# Patient Record
Sex: Female | Born: 1970 | Race: Black or African American | Hispanic: No | Marital: Married | Smoking: Never smoker
Health system: Southern US, Community
[De-identification: ages and names within clinical notes are randomized; demographics above are authoritative.]

## PROBLEM LIST (undated history)

## (undated) DIAGNOSIS — R9431 Abnormal electrocardiogram [ECG] [EKG]: Secondary | ICD-10-CM

## (undated) DIAGNOSIS — K589 Irritable bowel syndrome without diarrhea: Secondary | ICD-10-CM

## (undated) DIAGNOSIS — Z87448 Personal history of other diseases of urinary system: Secondary | ICD-10-CM

## (undated) DIAGNOSIS — D649 Anemia, unspecified: Secondary | ICD-10-CM

## (undated) DIAGNOSIS — M25511 Pain in right shoulder: Secondary | ICD-10-CM

## (undated) DIAGNOSIS — J45909 Unspecified asthma, uncomplicated: Secondary | ICD-10-CM

## (undated) DIAGNOSIS — K219 Gastro-esophageal reflux disease without esophagitis: Secondary | ICD-10-CM

## (undated) HISTORY — DX: Abnormal electrocardiogram (ECG) (EKG): R94.31

## (undated) HISTORY — DX: Irritable bowel syndrome, unspecified: K58.9

## (undated) HISTORY — PX: OTHER SURGICAL HISTORY: SHX169

## (undated) HISTORY — DX: Gastro-esophageal reflux disease without esophagitis: K21.9

## (undated) HISTORY — PX: ABLATION: SHX5711

## (undated) HISTORY — DX: Personal history of other diseases of urinary system: Z87.448

## (undated) HISTORY — DX: Anemia, unspecified: D64.9

---
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
## 1993-01-04 HISTORY — PX: TONSILLECTOMY: SUR1361

## 2004-04-04 HISTORY — PX: COLONOSCOPY W/ ENDOSCOPIC US: SHX1379

## 2004-04-16 ENCOUNTER — Ambulatory Visit: Payer: Self-pay | Admitting: Family Medicine

## 2004-05-04 HISTORY — PX: COLONOSCOPY: SHX174

## 2004-05-04 HISTORY — PX: ESOPHAGOGASTRODUODENOSCOPY: SHX1529

## 2004-06-04 HISTORY — PX: OTHER SURGICAL HISTORY: SHX169

## 2004-06-10 ENCOUNTER — Ambulatory Visit: Payer: Self-pay | Admitting: Family Medicine

## 2004-06-19 ENCOUNTER — Ambulatory Visit: Payer: Self-pay | Admitting: General Surgery

## 2005-01-13 ENCOUNTER — Ambulatory Visit: Payer: Self-pay | Admitting: Family Medicine

## 2005-01-19 ENCOUNTER — Ambulatory Visit: Payer: Self-pay | Admitting: Family Medicine

## 2005-02-04 HISTORY — PX: CHOLECYSTECTOMY: SHX55

## 2005-02-24 ENCOUNTER — Ambulatory Visit: Payer: Self-pay | Admitting: General Surgery

## 2005-03-11 ENCOUNTER — Ambulatory Visit: Payer: Self-pay | Admitting: Family Medicine

## 2005-06-07 ENCOUNTER — Ambulatory Visit: Payer: Self-pay | Admitting: Family Medicine

## 2005-11-23 ENCOUNTER — Ambulatory Visit: Payer: Self-pay

## 2005-12-23 ENCOUNTER — Ambulatory Visit: Payer: Self-pay | Admitting: Orthopaedic Surgery

## 2006-01-04 HISTORY — PX: SHOULDER ARTHROSCOPY W/ SUBACROMIAL DECOMPRESSION AND DISTAL CLAVICLE EXCISION: SHX2401

## 2006-01-13 ENCOUNTER — Ambulatory Visit: Payer: Self-pay | Admitting: Family Medicine

## 2006-01-13 ENCOUNTER — Other Ambulatory Visit: Admission: RE | Admit: 2006-01-13 | Discharge: 2006-01-13 | Payer: Self-pay | Admitting: Family Medicine

## 2006-01-13 ENCOUNTER — Encounter: Payer: Self-pay | Admitting: Family Medicine

## 2006-01-13 LAB — CONVERTED CEMR LAB
ALT: 14 units/L (ref 0–40)
AST: 23 units/L (ref 0–37)
BUN: 5 mg/dL — ABNORMAL LOW (ref 6–23)
Basophils Relative: 0.3 % (ref 0.0–1.0)
CO2: 29 meq/L (ref 19–32)
Creatinine, Ser: 0.8 mg/dL (ref 0.4–1.2)
Eosinophil percent: 0.2 % (ref 0.0–5.0)
Glucose, Bld: 93 mg/dL (ref 70–99)
HCT: 37.5 % (ref 36.0–46.0)
Hemoglobin: 12.3 g/dL (ref 12.0–15.0)
LDL Cholesterol: 74 mg/dL (ref 0–99)
Lymphocytes Relative: 22.8 % (ref 12.0–46.0)
Monocytes Absolute: 0.4 10*3/uL (ref 0.2–0.7)
Monocytes Relative: 6.6 % (ref 3.0–11.0)
Neutro Abs: 4.2 10*3/uL (ref 1.4–7.7)
Pap Smear: NORMAL
Phosphorus Concentration: 4 mg/dL (ref 2.3–4.6)
Potassium: 4 meq/L (ref 3.5–5.1)
RDW: 12 % (ref 11.5–14.6)
VLDL: 8 mg/dL (ref 0–40)
WBC: 5.9 10*3/uL (ref 4.5–10.5)

## 2006-12-20 ENCOUNTER — Telehealth: Payer: Self-pay | Admitting: Family Medicine

## 2006-12-21 ENCOUNTER — Encounter: Payer: Self-pay | Admitting: Family Medicine

## 2006-12-21 DIAGNOSIS — J309 Allergic rhinitis, unspecified: Secondary | ICD-10-CM | POA: Insufficient documentation

## 2006-12-21 DIAGNOSIS — D649 Anemia, unspecified: Secondary | ICD-10-CM | POA: Insufficient documentation

## 2006-12-21 DIAGNOSIS — G4730 Sleep apnea, unspecified: Secondary | ICD-10-CM | POA: Insufficient documentation

## 2006-12-21 DIAGNOSIS — K219 Gastro-esophageal reflux disease without esophagitis: Secondary | ICD-10-CM | POA: Insufficient documentation

## 2006-12-21 DIAGNOSIS — K589 Irritable bowel syndrome without diarrhea: Secondary | ICD-10-CM | POA: Insufficient documentation

## 2006-12-22 ENCOUNTER — Ambulatory Visit: Payer: Self-pay | Admitting: Family Medicine

## 2006-12-22 DIAGNOSIS — Z87448 Personal history of other diseases of urinary system: Secondary | ICD-10-CM

## 2006-12-22 LAB — CONVERTED CEMR LAB
Bilirubin Urine: NEGATIVE
Casts: 0 /lpf
Ketones, urine, test strip: NEGATIVE
Specific Gravity, Urine: 1.02
Urobilinogen, UA: 0.2
WBC, UA: 0 cells/hpf
Yeast, UA: 0

## 2006-12-26 ENCOUNTER — Ambulatory Visit: Payer: Self-pay | Admitting: Cardiology

## 2006-12-30 LAB — CONVERTED CEMR LAB
BUN: 2 mg/dL — ABNORMAL LOW (ref 6–23)
Basophils Relative: 0.3 % (ref 0.0–1.0)
CO2: 31 meq/L (ref 19–32)
Creatinine, Ser: 0.8 mg/dL (ref 0.4–1.2)
Eosinophils Relative: 0.4 % (ref 0.0–5.0)
Glucose, Bld: 89 mg/dL (ref 70–99)
HCT: 33.3 % — ABNORMAL LOW (ref 36.0–46.0)
Hemoglobin: 11.4 g/dL — ABNORMAL LOW (ref 12.0–15.0)
Lymphocytes Relative: 25.8 % (ref 12.0–46.0)
Monocytes Absolute: 0.4 10*3/uL (ref 0.2–0.7)
Neutro Abs: 2.9 10*3/uL (ref 1.4–7.7)
Neutrophils Relative %: 65.6 % (ref 43.0–77.0)
Potassium: 3.8 meq/L (ref 3.5–5.1)
RDW: 11.7 % (ref 11.5–14.6)
Sodium: 142 meq/L (ref 135–145)
WBC: 4.5 10*3/uL (ref 4.5–10.5)

## 2007-10-19 ENCOUNTER — Other Ambulatory Visit: Admission: RE | Admit: 2007-10-19 | Discharge: 2007-10-19 | Payer: Self-pay | Admitting: Family Medicine

## 2007-10-19 ENCOUNTER — Ambulatory Visit: Payer: Self-pay | Admitting: Family Medicine

## 2007-10-19 ENCOUNTER — Encounter: Payer: Self-pay | Admitting: Family Medicine

## 2007-10-23 LAB — CONVERTED CEMR LAB
ALT: 16 units/L (ref 0–35)
AST: 22 units/L (ref 0–37)
Basophils Absolute: 0 10*3/uL (ref 0.0–0.1)
Basophils Relative: 0.5 % (ref 0.0–3.0)
Bilirubin, Direct: 0.1 mg/dL (ref 0.0–0.3)
CO2: 29 meq/L (ref 19–32)
Chloride: 104 meq/L (ref 96–112)
Cholesterol: 148 mg/dL (ref 0–200)
Creatinine, Ser: 0.7 mg/dL (ref 0.4–1.2)
LDL Cholesterol: 75 mg/dL (ref 0–99)
Lymphocytes Relative: 23.9 % (ref 12.0–46.0)
MCHC: 34.2 g/dL (ref 30.0–36.0)
Monocytes Relative: 4.9 % (ref 3.0–12.0)
Neutrophils Relative %: 70.4 % (ref 43.0–77.0)
RBC: 3.94 M/uL (ref 3.87–5.11)
Sodium: 139 meq/L (ref 135–145)
Total Bilirubin: 1.2 mg/dL (ref 0.3–1.2)
Total CHOL/HDL Ratio: 2.1
VLDL: 4 mg/dL (ref 0–40)

## 2008-04-25 ENCOUNTER — Telehealth: Payer: Self-pay | Admitting: Family Medicine

## 2008-06-10 ENCOUNTER — Encounter: Payer: Self-pay | Admitting: Cardiovascular Disease

## 2008-06-10 ENCOUNTER — Ambulatory Visit: Payer: Self-pay | Admitting: Cardiovascular Disease

## 2008-06-10 DIAGNOSIS — R072 Precordial pain: Secondary | ICD-10-CM

## 2008-06-10 DIAGNOSIS — R079 Chest pain, unspecified: Secondary | ICD-10-CM

## 2008-06-12 ENCOUNTER — Ambulatory Visit: Payer: Self-pay | Admitting: Family Medicine

## 2008-06-12 DIAGNOSIS — R413 Other amnesia: Secondary | ICD-10-CM

## 2008-06-12 LAB — CONVERTED CEMR LAB
Nitrite: NEGATIVE
Protein, U semiquant: NEGATIVE
Urobilinogen, UA: 0.2
WBC Urine, dipstick: NEGATIVE

## 2008-06-24 ENCOUNTER — Telehealth: Payer: Self-pay | Admitting: Family Medicine

## 2008-06-27 ENCOUNTER — Ambulatory Visit: Payer: Self-pay | Admitting: Family Medicine

## 2008-06-27 DIAGNOSIS — R091 Pleurisy: Secondary | ICD-10-CM | POA: Insufficient documentation

## 2008-07-03 ENCOUNTER — Telehealth: Payer: Self-pay | Admitting: Family Medicine

## 2008-07-09 ENCOUNTER — Encounter: Payer: Self-pay | Admitting: Cardiovascular Disease

## 2008-07-09 ENCOUNTER — Ambulatory Visit: Payer: Self-pay

## 2008-07-19 ENCOUNTER — Ambulatory Visit: Payer: Self-pay | Admitting: Cardiology

## 2008-07-19 ENCOUNTER — Encounter: Payer: Self-pay | Admitting: Cardiology

## 2009-12-08 ENCOUNTER — Ambulatory Visit: Payer: Self-pay | Admitting: Unknown Physician Specialty

## 2010-05-19 NOTE — Assessment & Plan Note (Signed)
[facility] OFFICE NOTE   Laura Dickerson, Laura Dickerson                         MRN:          [id number]  DATE:12/26/2006                            DOB:          03-19-1970    I was asked by Dr. Milinda Antis to evaluate Laura Dickerson with an abnormal EKG.   HISTORY OF PRESENT ILLNESS:  [name] is a 40 year old married black  female who comes today with her husband with the above concern.   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   On the 15th she became extremely ill with a viral gastroenteritis.  She  had prolonged nausea and actually had to be admitted to the hospital for  IV fluids at [facility].  She was hypokalemic and hypomagnesemic per  her records.   During that hospital stay she had some chest discomfort, probably from  all the extreme vomiting.   Saw Dr. Milinda Antis in the office on December 22, 2006.  Her EKG showed some  biphasic T waves and T wave inversion in V1 through V4.  This was  despite her electrolytes being normalized.   She is totally asymptomatic.  She is extremely health conscious and  tries to exercise 2 to sometimes 3 times a week.  She has no symptoms of  coronary ischemia except that when she tries to jump rope, which she  just re-initiated, she gets a little bit short of breath.   She has no cardiac risk factors.  She says her lipids are good.   She denies any orthopnea, PND or peripheral edema.  She has had no  presyncope or syncope.   PAST MEDICAL HISTORY:  SHE IS INTOLERANT OF PENICILLIN.  She does not  smoke, does not drink or use recreational products.   SURGERIES:  1. She had a C-section in 1999 and 2002.  2. She has had a cholecystectomy February of 2007.  3. Tonsillectomy in 1995.  4. Shoulder surgery in December 2007.   MEDICINES:  Herbal vitamins.   FAMILY HISTORY:  Negative for premature coronary disease.   SOCIAL HISTORY:  She is married and has 2 children.   She lives in  [place].   REVIEW OF SYSTEMS:  Some occasional constipation and fatigue, otherwise  negative.   EXAMINATION:  Her blood pressure today is 116/73, her pulse is 62 and  regular, she is 5 foot 3, weighs 140 pounds, respiratory rate is 18.  SKIN:  Warm and dry, unremarkable.  Is alert and oriented x3.  HEENT:  Normocephalic/atraumatic, PERRLA, extraocular movements intact,  sclerae are clear, facial symmetry is normal.  NECK:  Supple, carotid upstrokes were equal bilaterally without bruits,  no JVD, thyroid is not enlarged, trachea is midline.  LUNGS:  Clear, no rub.  There is no chest wall tenderness.  PMI is nondisplaced, she has normal S1 and S2, this splits with  inspiration.  There is no S4, there is no gallop, there is no click.  ABDOMINAL:  Soft with good bowel sounds, no midline bruit.  EXTREMITIES:  Reveal no edema,  pulses are intact.  NEURO:  Exam is intact.   I repeated her EKG today which shows sinus bradycardia at a rate of 59  with T wave inversion in T1, biphasic T wave in V2.   ASSESSMENT AND PLAN:  Laura Dickerson clearly is extremely heart healthy and  has no significant risk factors for any coronary disease.  There is no  sign or symptoms of any kind of underlying cardiac problem.  Her T wave  inversion is largely resolved, though I told her it is normal for women  to have T wave inversion in V1 and sometimes even in V2.   At this point I do not see any need for further cardiac evaluation.  I  have encouraged her to exercise 3 hours a week.  Will see her back on a  p.r.n. basis.     Thomas C. Daleen Squibb, MD, [facility]  Electronically Signed    TCW/MedQ  DD: 12/26/2006  DT: 12/26/2006  Job #: [id number]   cc:   Marne A. Milinda Antis, MD

## 2010-06-15 ENCOUNTER — Encounter: Payer: Self-pay | Admitting: Cardiovascular Disease

## 2010-09-06 ENCOUNTER — Ambulatory Visit: Payer: Self-pay

## 2011-02-04 ENCOUNTER — Ambulatory Visit: Payer: Self-pay | Admitting: Internal Medicine

## 2012-07-31 IMAGING — CT CT HEAD WITHOUT CONTRAST
2 series · 15 of 30 positions shown, 19 images · non-contrast
Comparison: none

REASON FOR EXAM: STAT CR [id number] per CT severe headache nausea
COMMENTS:

PROCEDURE:     CT  - CT HEAD WITHOUT CONTRAST  - February 04, 2011 [DATE]
RESULT:     Comparison:  None
TECHNIQUE: Multiple axial images from the foramen magnum to the vertex were
obtained without IV contrast.

[Series 2: without · axial · non-contrast · 0.39mm/px · z∈[-122,-8]mm · 13 of 27 slices shown, 17 images]
[im 2/27  brain]
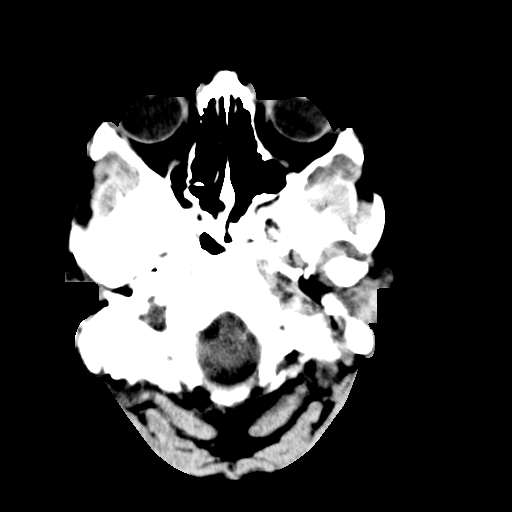
[im 2/27  bone]
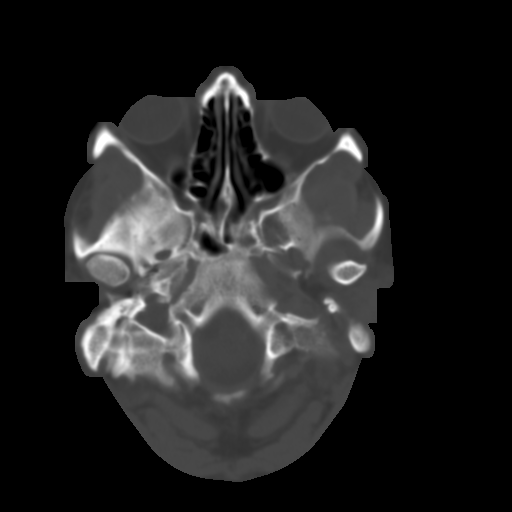
[im 4/27  brain]
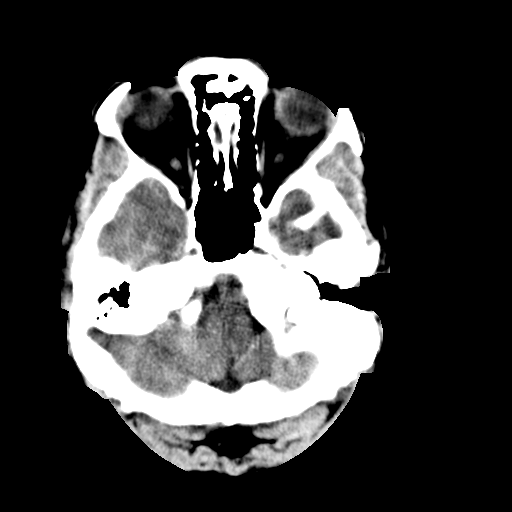
[im 6/27  brain]
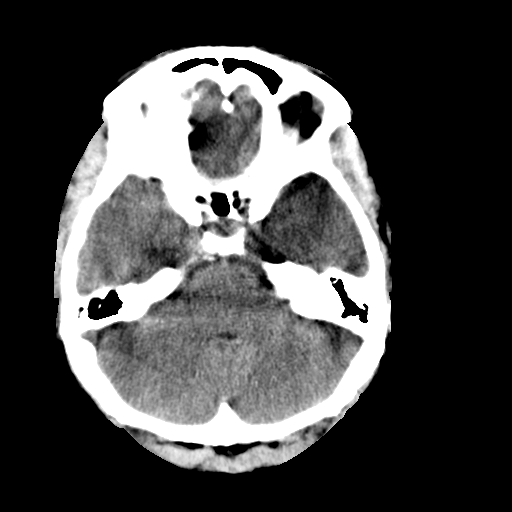
[im 8/27  brain]
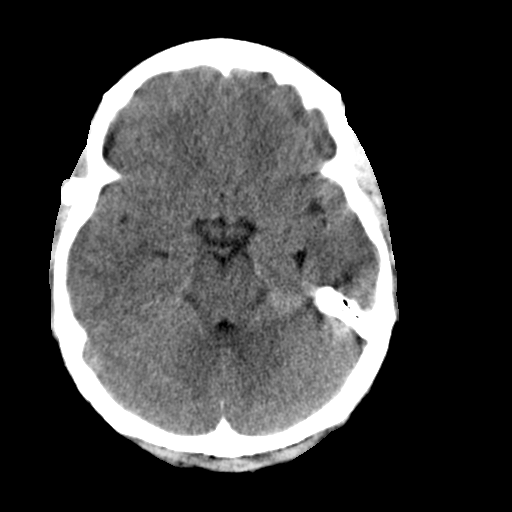
[im 10/27  brain]
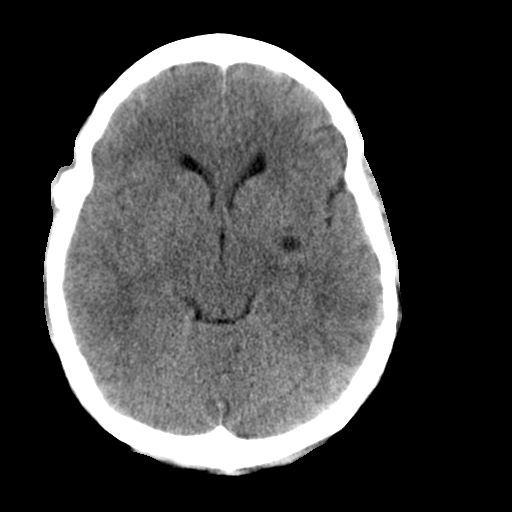
[im 10/27  bone]
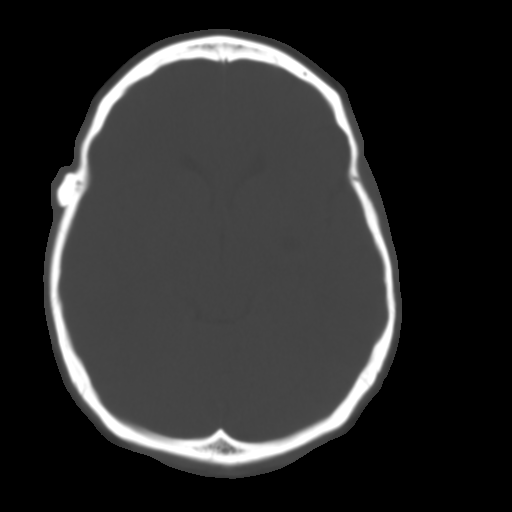
[im 12/27  brain]
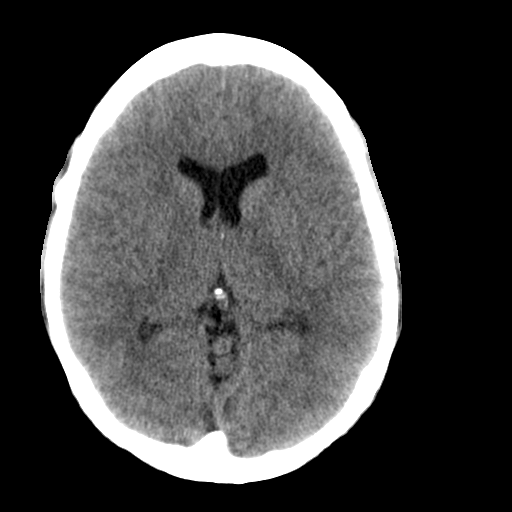
[im 14/27  brain]
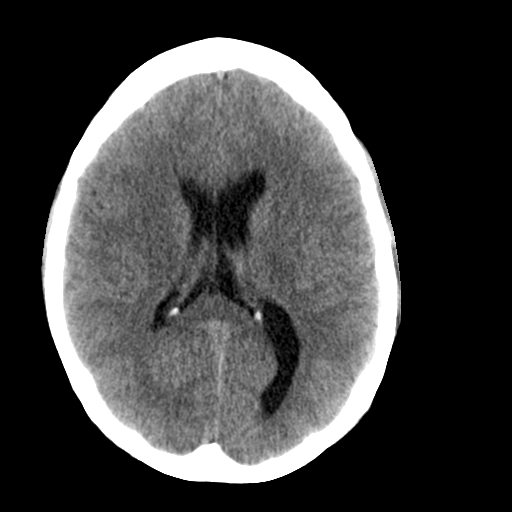
[im 15/27  brain]
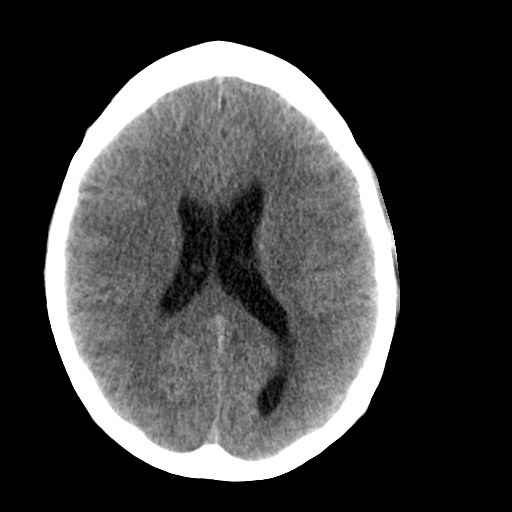
[im 17/27  brain]
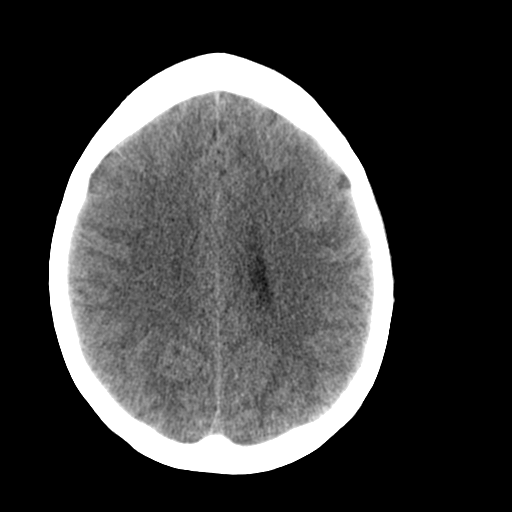
[im 17/27  bone]
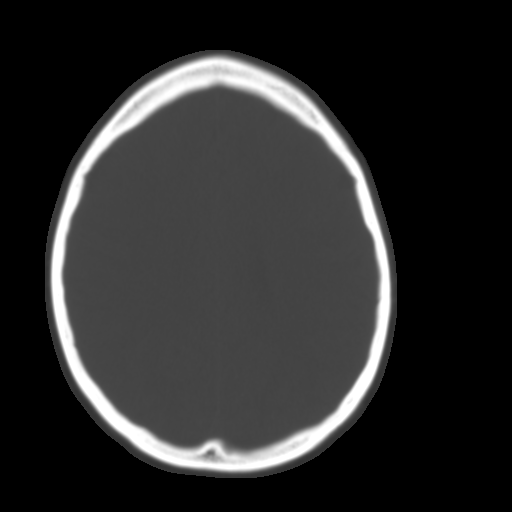
[im 19/27  brain]
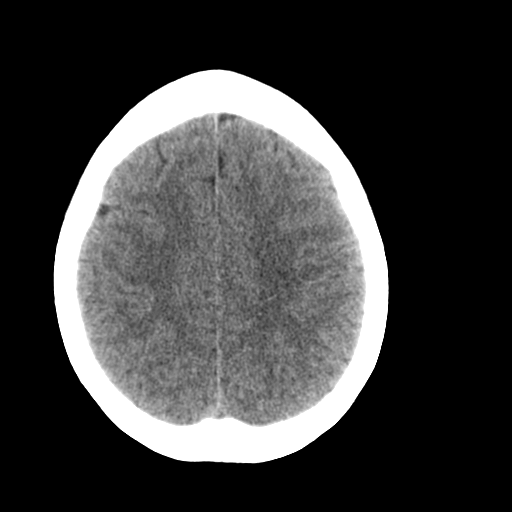
[im 21/27  brain]
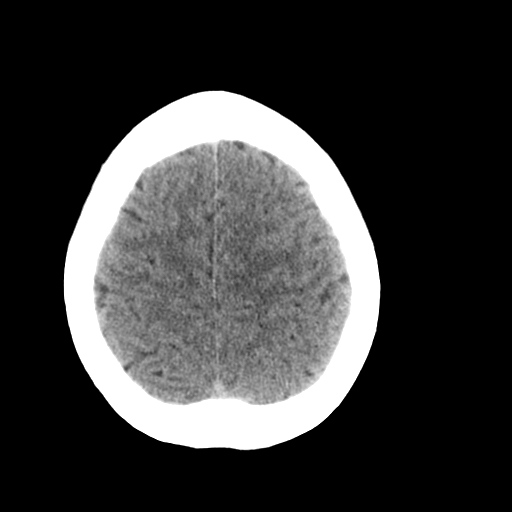
[im 23/27  brain]
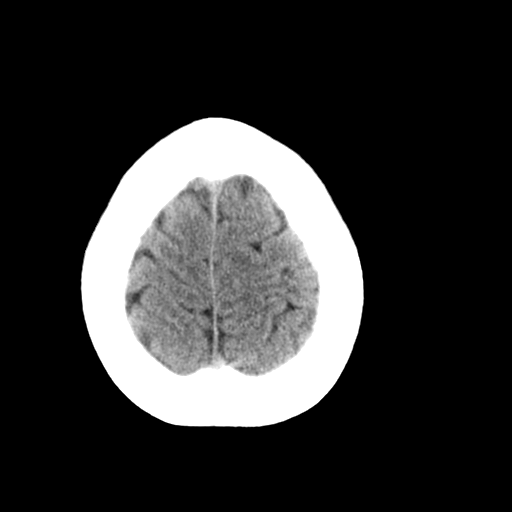
[im 25/27  brain]
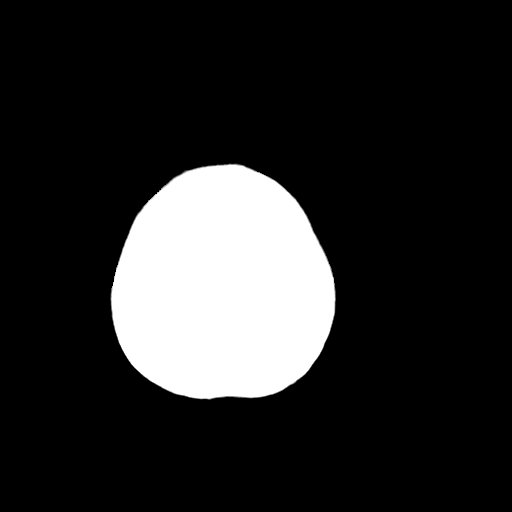
[im 25/27  bone]
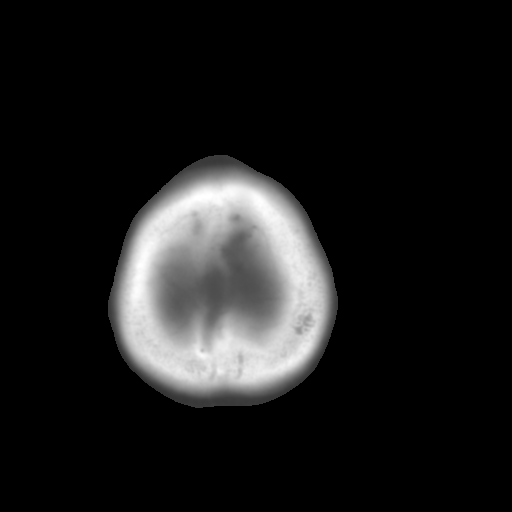

[Series 3: bone · axial · 0.39mm/px · z∈[-122,-102]mm · 2 of 27 slices shown]
[im 2/27  bone]
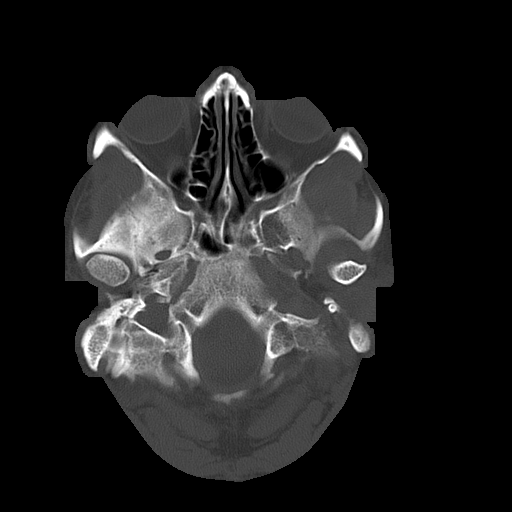
[im 6/27  bone]
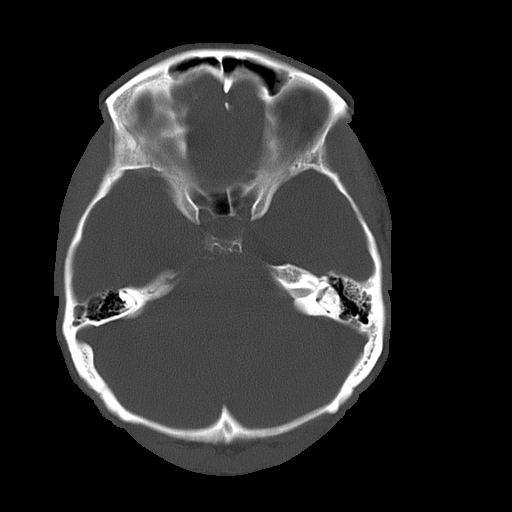

[15 of 30 positions shown; findings below may reference images not displayed]

FINDINGS: There is no evidence of mass effect or midline shift. No extra-axial fluid
collection. No intracranial hemorrhage. No acute cortical-based infarct.
There is a small round focus of hypoattenuation along the inferior aspect of
the left basal ganglia. This may represent a prominent Virchow-Robin space
versus an age-indeterminate lacunar infarct.

Small well-corticated ossific density along the lateral right frontal skull
is nonspecific, but likely an osteoma.
IMPRESSION: 1. No intracranial hemorrhage.
2. Small focus of hypoattenuation in the left basal ganglia may represent a
prominent Virchow-Robin space versus an age indeterminate, likely chronic,
lacunar infarct. Further evaluation with MRI is suggested.

This was called to the ordering clinician at the time of dictation.

## 2020-01-31 ENCOUNTER — Other Ambulatory Visit: Payer: Self-pay | Admitting: General Surgery

## 2020-01-31 NOTE — Progress Notes (Signed)
Subjective:     Patient ID: Laura Dickerson is a 50 y.o. female.  HPI  The following portions of the patient's history were reviewed and updated as appropriate.  This a new patient is here today for: office visit. The patient is here today to discuss a colonoscopy.      Chief Complaint  Patient presents with  . eval for colonoscopy     BP 120/80   Pulse 65   Temp 36.8 C (98.2 F)   Ht 160 cm (5' 3" )   Wt 67.3 kg (148 lb 6.4 oz)   BMI 26.29 kg/m       Past Medical History:  Diagnosis Date  . Allergic rhinitis   . Anemia   . Asthma without status asthmaticus, unspecified    NO INHALER IN 2 YEARS  . GERD (gastroesophageal reflux disease)   . History of hematuria   . IBS (irritable bowel syndrome)   . Right shoulder pain 2016          Past Surgical History:  Procedure Laterality Date  . ARTHROSCOPIC SUBACROMIAL DECOMP Right 08/04/2016   Procedure: ARTHROSCOPY, SHOULDER DECOMPRESSION SUBACROMIAL SPACE W/PARTIAL ACROMIOPLASTY, W/CORACOACROMIAL LIGAMENT RELEASE;  Surgeon: Alfonso Patten, MD;  Location: [facility] OR;  Service: Orthopedics;  Laterality: Right;  . ARTHROSCOPY SHOULDER DISTAL CLAVICULECTOMY W/DISTAL ARTICULAR SURFACE Right 08/04/2016   Procedure: ARTHROSCOPY, SHOULDER DISTAL CLAVICULECTOMY INCLUDING DISTAL ARTICULAR SURFACE ([facility] PROCEDURE);  Surgeon: Alfonso Patten, MD;  Location: [facility] OR;  Service: Orthopedics;  Laterality: Right;  . CESAREAN SECTION     X 2  . CHOLECYSTECTOMY  02/24/1996  . COLONOSCOPY  04/2004  . LAPAROSCOPIC CHOLECYSTECTOMY  2008  . MYRINGOTOMY  1978  . RIGHT SHOULDER IMPINGEMENT  2007  . TONSILLECTOMY    . UTERINE ABLATION  2014      OB History   No obstetric history on file.     Social History          Socioeconomic History  . Marital status: Married    Spouse name: BART  . Number of children: 2  . Years of education: Not on file  . Highest education level: Not on file   Occupational History  . Occupation: [employer] TECH  Tobacco Use  . Smoking status: Never Smoker  . Smokeless tobacco: Never Used  Vaping Use  . Vaping Use: Never used  Substance and Sexual Activity  . Alcohol use: No  . Drug use: No  . Sexual activity: Not on file  Other Topics Concern  . Not on file  Social History Narrative  . Not on file   Social Determinants of Health   Financial Resource Strain: Not on file  Food Insecurity: Not on file  Transportation Needs: Not on file            Allergies  Allergen Reactions  . Penicillins Hives, Rash and Other (See Comments)    REACTION: rash  . Augmentin [Amoxicillin-Pot Clavulanate] Hives and Nausea  . Doxycycline Nausea, Other (See Comments) and Vomiting    "GI upset"  . Oxycodone-Acetaminophen Nausea And Vomiting    Current Medications        Current Outpatient Medications  Medication Sig Dispense Refill  . acetaminophen (TYLENOL) 325 MG tablet Take 650 mg by mouth every 4 (four) hours as needed for Pain.    Marland Kitchen CALCIUM-MAGNESIUM-ZINC ORAL Take by mouth once daily    . cholecalciferol, vitamin D3, (VITAMIN D3) 125 mcg (5,000 unit) tablet Take 5,000 Units by mouth once daily    .  ibuprofen (ADVIL,MOTRIN) 200 MG tablet Take 400 mg by mouth every 6 (six) hours as needed for Pain.    . multivitamin capsule 1 tab by mouth daily    . vit C/multivit-mins/calcium/D3 (EMERGEN-C VITAMIN D-CALCIUM ORAL) Take by mouth once daily     No current facility-administered medications for this visit.           Family History  Problem Relation Age of Onset  . High blood pressure (Hypertension) Mother   . Stroke Father   . Myocardial Infarction (Heart attack) Father   . Breast cancer Maternal Grandmother   . Colon cancer Paternal Grandfather   . Anesthesia problems Neg Hx         Review of Systems  Constitutional: Negative for chills and fever.  Respiratory: Negative for cough.         Objective:   Physical Exam Exam conducted with a chaperone present.  Constitutional:      Appearance: Normal appearance.  Cardiovascular:     Rate and Rhythm: Normal rate and regular rhythm.     Pulses: Normal pulses.     Heart sounds: Normal heart sounds.  Pulmonary:     Effort: Pulmonary effort is normal.     Breath sounds: Normal breath sounds.  Musculoskeletal:     Cervical back: Neck supple.  Skin:    General: Skin is warm and dry.  Neurological:     Mental Status: She is alert and oriented to person, place, and time.  Psychiatric:        Mood and Affect: Mood normal.        Behavior: Behavior normal.    Labs and Radiology:   October 11, 2018:   Glucose 70 - 110 mg/dL 89   Sodium 136 - 145 mmol/L 136   Potassium 3.6 - 5.1 mmol/L 4.2   Chloride 97 - 109 mmol/L 101   Carbon Dioxide (CO2) 22.0 - 32.0 mmol/L 30.8   Urea Nitrogen (BUN) 7 - 25 mg/dL 12   Creatinine 0.6 - 1.1 mg/dL 0.7   Glomerular Filtration Rate (eGFR), MDRD Estimate >60 mL/min/1.73sq m 108   Calcium 8.7 - 10.3 mg/dL 9.8   AST  8 - 39 U/L 19   ALT  5 - 38 U/L 15   Alk Phos (alkaline Phosphatase) 34 - 104 U/L 90   Albumin 3.5 - 4.8 g/dL 4.4   Bilirubin, Total 0.3 - 1.2 mg/dL 0.6   Protein, Total 6.1 - 7.9 g/dL 6.9   A/G Ratio 1.0 - 5.0 gm/dL 1.8    WBC (White Blood Cell Count) 4.1 - 10.2 10^3/uL 7.6   RBC (Red Blood Cell Count) 4.04 - 5.48 10^6/uL 4.06   Hemoglobin 12.0 - 15.0 gm/dL 12.8   Hematocrit 35.0 - 47.0 % 38.6   MCV (Mean Corpuscular Volume) 80.0 - 100.0 fl 95.1   MCH (Mean Corpuscular Hemoglobin) 27.0 - 31.2 pg 31.5High   MCHC (Mean Corpuscular Hemoglobin Concentration) 32.0 - 36.0 gm/dL 33.2   Platelet Count 150 - 450 10^3/uL 241   RDW-CV (Red Cell Distribution Width) 11.6 - 14.8 % 11.9   MPV (Mean Platelet Volume) 9.4 - 12.4 fl 9.8   Neutrophils 1.50 - 7.80 10^3/uL 5.68   Lymphocytes 1.00 - 3.60 10^3/uL 1.43   Monocytes 0.00 - 1.50 10^3/uL 0.41   Eosinophils 0.00 - 0.55  10^3/uL 0.02   Basophils 0.00 - 0.09 10^3/uL 0.03   Neutrophil % 32.0 - 70.0 % 74.8High   Lymphocyte % 10.0 - 50.0 % 18.8  Monocyte % 4.0 - 13.0 % 5.4   Eosinophil % 1.0 - 5.0 % 0.3Low   Basophil% 0.0 - 2.0 % 0.4   Immature Granulocyte % <=0.7 % 0.3   Immature Granulocyte Count <=0.06 10^3/L 0.02           Assessment:     Candidate for colon cancer screening.       Plan:     Options for management reviewed including 1) colonoscopy versus 2) Cologuard testing.  Pros and cons of each reviewed.  Risks associated with colonoscopy discussed including bleeding and perforation.  Patient desires to proceed with colonoscopy.     Entered by Ledell Noss, CMA, acting as a scribe for Dr. Hervey Ard, MD.   The documentation recorded by the scribe accurately reflects the service I personally performed and the decisions made by me.   Robert Bellow, MD FACS

## 2020-02-21 ENCOUNTER — Encounter: Payer: Self-pay | Admitting: General Surgery

## 2020-02-21 ENCOUNTER — Other Ambulatory Visit: Payer: Self-pay

## 2020-02-21 ENCOUNTER — Other Ambulatory Visit
Admission: RE | Admit: 2020-02-21 | Discharge: 2020-02-21 | Disposition: A | Discharge status: Home / Self Care | Payer: 59 | Source: Ambulatory Visit | Attending: General Surgery | Admitting: General Surgery

## 2020-02-21 DIAGNOSIS — Z01812 Encounter for preprocedural laboratory examination: Secondary | ICD-10-CM | POA: Insufficient documentation

## 2020-02-21 DIAGNOSIS — Z20822 Contact with and (suspected) exposure to covid-19: Secondary | ICD-10-CM | POA: Diagnosis not present

## 2020-02-21 LAB — SARS CORONAVIRUS 2 (TAT 6-24 HRS): SARS Coronavirus 2: NEGATIVE

## 2020-02-22 ENCOUNTER — Other Ambulatory Visit: Payer: Self-pay

## 2020-02-22 ENCOUNTER — Ambulatory Visit
Admission: RE | Admit: 2020-02-22 | Discharge: 2020-02-22 | Disposition: A | Discharge status: Home / Self Care | Payer: 59 | Attending: General Surgery | Admitting: General Surgery

## 2020-02-22 ENCOUNTER — Encounter
Admission: RE | Disposition: A | Discharge status: Home / Self Care | Payer: Self-pay | Source: Home / Self Care | Attending: General Surgery

## 2020-02-22 ENCOUNTER — Ambulatory Visit: Payer: 59 | Admitting: Registered Nurse

## 2020-02-22 ENCOUNTER — Encounter: Payer: Self-pay | Admitting: General Surgery

## 2020-02-22 DIAGNOSIS — Z880 Allergy status to penicillin: Secondary | ICD-10-CM | POA: Insufficient documentation

## 2020-02-22 DIAGNOSIS — Z79899 Other long term (current) drug therapy: Secondary | ICD-10-CM | POA: Insufficient documentation

## 2020-02-22 DIAGNOSIS — Z1211 Encounter for screening for malignant neoplasm of colon: Secondary | ICD-10-CM | POA: Diagnosis not present

## 2020-02-22 DIAGNOSIS — Z885 Allergy status to narcotic agent status: Secondary | ICD-10-CM | POA: Insufficient documentation

## 2020-02-22 DIAGNOSIS — Z791 Long term (current) use of non-steroidal anti-inflammatories (NSAID): Secondary | ICD-10-CM | POA: Diagnosis not present

## 2020-02-22 DIAGNOSIS — Z881 Allergy status to other antibiotic agents status: Secondary | ICD-10-CM | POA: Insufficient documentation

## 2020-02-22 DIAGNOSIS — K5730 Diverticulosis of large intestine without perforation or abscess without bleeding: Secondary | ICD-10-CM | POA: Insufficient documentation

## 2020-02-22 HISTORY — DX: Pain in right shoulder: M25.511

## 2020-02-22 HISTORY — DX: Unspecified asthma, uncomplicated: J45.909

## 2020-02-22 HISTORY — PX: COLONOSCOPY WITH PROPOFOL: SHX5780

## 2020-02-22 SURGERY — COLONOSCOPY WITH PROPOFOL
Anesthesia: General

## 2020-02-22 MED ORDER — SODIUM CHLORIDE 0.9 % IV SOLN: INTRAVENOUS | Status: DC

## 2020-02-22 MED ORDER — PROPOFOL 500 MG/50ML IV EMUL
INTRAVENOUS | Status: DC | PRN
  Administered 2020-02-22: 140 ug/kg/min via INTRAVENOUS

## 2020-02-22 MED ORDER — PROPOFOL 10 MG/ML IV BOLUS
INTRAVENOUS | Status: DC | PRN
  Administered 2020-02-22: 20 mg via INTRAVENOUS
  Administered 2020-02-22: 70 mg via INTRAVENOUS

## 2020-02-22 NOTE — Op Note (Signed)
[facility] Gastroenterology Patient Name: Laura Dickerson Procedure Date: 02/22/2020 7:24 AM MRN: [id number] Account #: [id number] Date of Birth: 1970-03-03 Admit Type: Outpatient Age: 50 Room: [facility] ENDO ROOM 1 Gender: Female Note Status: Finalized Procedure:             Colonoscopy Indications:           Screening for colorectal malignant neoplasm Providers:             Earline Mayotte, MD Referring MD:          Audrie Gallus. Tower (Referring MD) Medicines:             Monitored Anesthesia Care Complications:         No immediate complications. Procedure:             Pre-Anesthesia Assessment:                        - Prior to the procedure, a History and Physical was                         performed, and patient medications, allergies and                         sensitivities were reviewed. The patient's tolerance                         of previous anesthesia was reviewed.                        - The risks and benefits of the procedure and the                         sedation options and risks were discussed with the                         patient. All questions were answered and informed                         consent was obtained.                        After obtaining informed consent, the colonoscope was                         passed under direct vision. Throughout the procedure,                         the patient's blood pressure, pulse, and oxygen                         saturations were monitored continuously. The                         Colonoscope was introduced through the anus and                         advanced to the the cecum, identified by appendiceal                         orifice and ileocecal  valve. The colonoscopy was                         performed without difficulty. The patient tolerated                         the procedure well. The quality of the bowel                         preparation was excellent. Findings:      A few  medium-mouthed diverticula were found in the sigmoid colon.      The retroflexed view of the distal rectum and anal verge was normal and       showed no anal or rectal abnormalities. Impression:            - Diverticulosis in the sigmoid colon.                        - The distal rectum and anal verge are normal on                         retroflexion view.                        - No specimens collected. Recommendation:        - Repeat colonoscopy in 10 years for screening                         purposes. Procedure Code(s):     --- Professional ---                        [phone], Colonoscopy, flexible; diagnostic, including                         collection of specimen(s) by brushing or washing, when                         performed (separate procedure) Diagnosis Code(s):     --- Professional ---                        Z12.11, Encounter for screening for malignant neoplasm                         of colon                        K57.30, Diverticulosis of large intestine without                         perforation or abscess without bleeding CPT copyright 2019 American Medical Association. All rights reserved. The codes documented in this report are preliminary and upon coder review may  be revised to meet current compliance requirements. Earline Mayotte, MD 02/22/2020 7:55:04 AM This report has been signed electronically. Number of Addenda: 0 Note Initiated On: 02/22/2020 7:24 AM Scope Withdrawal Time: 0 hours 8 minutes 6 seconds  Total Procedure Duration: 0 hours 15 minutes 19 seconds  Estimated Blood Loss:  Estimated blood loss: none.      [facility]

## 2020-02-22 NOTE — Transfer of Care (Signed)
Immediate Anesthesia Transfer of Care Note  Patient: Laura Dickerson  Procedure(s) Performed: COLONOSCOPY WITH PROPOFOL (N/A )  Patient Location: PACU  Anesthesia Type:General  Level of Consciousness: awake, alert  and oriented  Airway & Oxygen Therapy: Patient Spontanous Breathing  Post-op Assessment: Report given to RN and Post -op Vital signs reviewed and stable  Post vital signs: Reviewed and stable  Last Vitals:  Vitals Value Taken Time  BP 95/64 02/22/20 0757  Temp 36.2 C 02/22/20 0756  Pulse 64 02/22/20 0758  Resp 18 02/22/20 0758  SpO2 99 % 02/22/20 0758  Vitals shown include unvalidated device data.  Last Pain:  Vitals:   02/22/20 0756  TempSrc: Temporal  PainSc: Asleep         Complications: No complications documented.

## 2020-02-22 NOTE — Anesthesia Preprocedure Evaluation (Signed)
Anesthesia Evaluation  Patient identified by MRN, date of birth, ID band Patient awake    Reviewed: Allergy & Precautions, H&P , NPO status , Patient's Chart, lab work & pertinent test results, reviewed documented beta blocker date and time   Airway Mallampati: II   Neck ROM: full    Dental  (+) Poor Dentition   Pulmonary asthma , sleep apnea ,    Pulmonary exam normal        Cardiovascular Exercise Tolerance: Good negative cardio ROS Normal cardiovascular exam Rhythm:regular Rate:Normal     Neuro/Psych negative neurological ROS  negative psych ROS   GI/Hepatic Neg liver ROS, GERD  ,  Endo/Other  negative endocrine ROS  Renal/GU negative Renal ROS  negative genitourinary   Musculoskeletal   Abdominal   Peds  Hematology  (+) Blood dyscrasia, anemia ,   Anesthesia Other Findings Past Medical History: No date: Allergic rhinitis No date: Anemia     Comment:  NOS No date: Asthma No date: EKG abnormalities     Comment:  Baseline T wave inversions No date: GERD (gastroesophageal reflux disease) No date: History of hematuria No date: IBS (irritable bowel syndrome) No date: Right shoulder pain Past Surgical History: No date: ABLATION 1999, 2002: CESAREAN SECTION 02/2005: CHOLECYSTECTOMY     Comment:  Adhesions, lysis 05/2004: COLONOSCOPY     Comment:  Neg 04/2004: COLONOSCOPY W/ ENDOSCOPIC [place]     Comment:  neg No date: CT chest, abd, pelvis     Comment:  ? ruptured ovarian cyst 05/2004: ESOPHAGOGASTRODUODENOSCOPY     Comment:  neg No date: Exercise stress test No date: Hida     Comment:  Decreased gall bladder EF, 19.1% 06/2004: LS films     Comment:  neg 1978: MYRINGOTOMY     Comment:  Tubes 06/2004: Pelvic ultrasound     Comment:  1.3 cm cyst, right ovary 01/2006: SHOULDER ARTHROSCOPY W/ SUBACROMIAL DECOMPRESSION AND DISTAL  CLAVICLE EXCISION     Comment:  Right, aconial decompression 1995:  TONSILLECTOMY BMI    Body Mass Index: 25.69 kg/m     Reproductive/Obstetrics negative OB ROS                             Anesthesia Physical Anesthesia Plan  ASA: III  Anesthesia Plan: General   Post-op Pain Management:    Induction:   PONV Risk Score and Plan:   Airway Management Planned:   Additional Equipment:   Intra-op Plan:   Post-operative Plan:   Informed Consent: I have reviewed the patients History and Physical, chart, labs and discussed the procedure including the risks, benefits and alternatives for the proposed anesthesia with the patient or authorized representative who has indicated his/her understanding and acceptance.     Dental Advisory Given  Plan Discussed with: CRNA  Anesthesia Plan Comments:         Anesthesia Quick Evaluation

## 2020-02-22 NOTE — H&P (Signed)
Laura Dickerson [id number] [birth year]     HPI:  Healthy woman for screening colonoscopy.  Tolerated prep well.   Medications Prior to Admission  Medication Sig Dispense Refill Last Dose  . acetaminophen (TYLENOL) 325 MG tablet Take 650 mg by mouth every 4 (four) hours as needed for mild pain.   Past Week at Unknown time  . Ascorbic Acid (VITAMIN C PO) Take 1 tablet by mouth daily.   Past Week at 08  . dextromethorphan-guaiFENesin [facility] DM) 30-600 MG per 12 hr tablet Take 0.5 tablets by mouth 2 (two) times daily as needed.   Past Week at Unknown time  . ibuprofen (ADVIL) 200 MG tablet Take 200 mg by mouth every 6 (six) hours as needed for moderate pain.   Past Week at Unknown time  . Multiple Vitamin (MULTIVITAMIN) capsule Take 1 capsule by mouth daily.   Past Week at Unknown time  . Senna (CORRECTOL HERBAL TEA PO) Take by mouth.   Past Week at Unknown time   Allergies  Allergen Reactions  . Augmentin [Amoxicillin-Pot Clavulanate] Hives and Nausea Only  . Doxycycline Nausea And Vomiting  . Oxycodone-Acetaminophen Nausea And Vomiting  . Penicillins     REACTION: rash   Past Medical History:  Diagnosis Date  . Allergic rhinitis   . Anemia    NOS  . Asthma   . EKG abnormalities    Baseline T wave inversions  . GERD (gastroesophageal reflux disease)   . History of hematuria   . IBS (irritable bowel syndrome)   . Right shoulder pain    Past Surgical History:  Procedure Laterality Date  . ABLATION    . CESAREAN SECTION  1999, 2002  . CHOLECYSTECTOMY  02/2005   Adhesions, lysis  . COLONOSCOPY  05/2004   Neg  . COLONOSCOPY W/ ENDOSCOPIC [place]  04/2004   neg  . CT chest, abd, pelvis     ? ruptured ovarian cyst  . ESOPHAGOGASTRODUODENOSCOPY  05/2004   neg  . Exercise stress test    . Hida     Decreased gall bladder EF, 19.1%  . LS films  06/2004   neg  . MYRINGOTOMY  1978   Tubes  . Pelvic ultrasound  06/2004   1.3 cm cyst, right ovary  . SHOULDER ARTHROSCOPY W/  SUBACROMIAL DECOMPRESSION AND DISTAL CLAVICLE EXCISION  01/2006   Right, aconial decompression  . TONSILLECTOMY  1995   Social History   Socioeconomic History  . Marital status: Married    Spouse name: Not on file  . Number of children: Not on file  . Years of education: Not on file  . Highest education level: Not on file  Occupational History  . Occupation: Visual merchandiser: [employer]  Tobacco Use  . Smoking status: Never Smoker  . Smokeless tobacco: Never Used  Vaping Use  . Vaping Use: Never used  Substance and Sexual Activity  . Alcohol use: No  . Drug use: Never  . Sexual activity: Not on file  Other Topics Concern  . Not on file  Social History Narrative   Married (Husband with vasectomy)   2 children, play football   In pre-nursing studies now   Social Determinants of Health   Financial Resource Strain: Not on file  Food Insecurity: Not on file  Transportation Needs: Not on file  Physical Activity: Not on file  Stress: Not on file  Social Connections: Not on file  Intimate Partner Violence: Not on file  Social History   Social History Narrative   Married (Husband with vasectomy)   2 children, play football   In pre-nursing studies now     ROS: Negative.     PE: HEENT: Negative. Lungs: Clear. Cardio: RR. Assessment/Plan:  Proceed with planned endoscopy.  Merrily Pew [facility] 02/22/2020

## 2020-02-24 NOTE — Anesthesia Postprocedure Evaluation (Signed)
Anesthesia Post Note  Patient: MAELLE SHEAFFER  Procedure(s) Performed: COLONOSCOPY WITH PROPOFOL (N/A )  Patient location during evaluation: PACU Anesthesia Type: General Level of consciousness: awake and alert Pain management: pain level controlled Vital Signs Assessment: post-procedure vital signs reviewed and stable Respiratory status: spontaneous breathing, nonlabored ventilation, respiratory function stable and patient connected to nasal cannula oxygen Cardiovascular status: blood pressure returned to baseline and stable Postop Assessment: no apparent nausea or vomiting Anesthetic complications: no   No complications documented.   Last Vitals:  Vitals:   02/22/20 0705 02/22/20 0756  BP: 116/76 95/64  Pulse: 69 66  Resp: 20 19  Temp: (!) 36.1 C (!) 36.2 C  SpO2: 100% 99%    Last Pain:  Vitals:   02/22/20 0826  TempSrc:   PainSc: 0-No pain                 Yevette Edwards

## 2020-02-25 ENCOUNTER — Encounter: Payer: Self-pay | Admitting: General Surgery

## 2022-02-07 ENCOUNTER — Ambulatory Visit
Admission: EM | Admit: 2022-02-07 | Discharge: 2022-02-07 | Disposition: A | Discharge status: Home / Self Care | Payer: 59 | Attending: Emergency Medicine | Admitting: Emergency Medicine

## 2022-02-07 ENCOUNTER — Encounter: Payer: Self-pay | Admitting: Emergency Medicine

## 2022-02-07 DIAGNOSIS — Z20822 Contact with and (suspected) exposure to covid-19: Secondary | ICD-10-CM

## 2022-02-07 DIAGNOSIS — R519 Headache, unspecified: Secondary | ICD-10-CM | POA: Diagnosis not present

## 2022-02-07 LAB — RESP PANEL BY RT-PCR (RSV, FLU A&B, COVID)  RVPGX2
Influenza A by PCR: NEGATIVE
Influenza B by PCR: NEGATIVE
Resp Syncytial Virus by PCR: NEGATIVE
SARS Coronavirus 2 by RT PCR: NEGATIVE

## 2022-02-07 NOTE — ED Provider Notes (Signed)
[facility]    CSN: [id number] Arrival date & time: 02/07/22  1025      History   Chief Complaint Chief Complaint  Patient presents with   Headache    HPI Laura Dickerson is a 52 y.o. female presenting for mild headaches for the past couple days.  This is not unusual for her as she has allergies and states with weather changes she gets headaches.  She has taken Tylenol for it.  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  She denies cough, congestion, sore throat, sinus pain, chest pain, shortness of breath, vomiting or diarrhea.  No other concerns.  HPI  Past Medical History:  Diagnosis Date   Allergic rhinitis    Anemia    NOS   Asthma    EKG abnormalities    Baseline T wave inversions   GERD (gastroesophageal reflux disease)    History of hematuria    IBS (irritable bowel syndrome)    Right shoulder pain     Patient Active Problem List   Diagnosis Date Noted   PLEURISY 06/27/2008   MEMORY LOSS 06/12/2008   CHEST PAIN-UNSPECIFIED 06/10/2008   CHEST PAIN-PRECORDIAL 06/10/2008   HEMATURIA, HX OF 12/22/2006   ANEMIA-NOS 12/21/2006   ALLERGIC RHINITIS 12/21/2006   GERD 12/21/2006   IRRITABLE BOWEL SYNDROME 12/21/2006   SLEEP APNEA 12/21/2006    Past Surgical History:  Procedure Laterality Date   ABLATION     CESAREAN SECTION  1999, 2002   CHOLECYSTECTOMY  02/2005   Adhesions, lysis   COLONOSCOPY  05/2004   Neg   COLONOSCOPY W/ ENDOSCOPIC [place]  04/2004   neg   COLONOSCOPY WITH PROPOFOL N/A 02/22/2020   Procedure: COLONOSCOPY WITH PROPOFOL;  Surgeon: Robert Bellow, MD;  Location: [facility];  Service: Endoscopy;  Laterality: N/A;  Pending C-19 test done on 02/21/2020   CT chest, abd, pelvis     ? ruptured ovarian cyst   ESOPHAGOGASTRODUODENOSCOPY  05/2004   neg   Exercise stress test     [place]     Decreased gall bladder EF, 19.1%   LS films   06/2004   neg   MYRINGOTOMY  1978   Tubes   Pelvic ultrasound  06/2004   1.3 cm cyst, right ovary   SHOULDER ARTHROSCOPY W/ SUBACROMIAL DECOMPRESSION AND DISTAL CLAVICLE EXCISION  01/2006   Right, aconial decompression   TONSILLECTOMY  1995    OB History   No obstetric history on file.      Home Medications    Prior to Admission medications   Medication Sig Start Date End Date Taking? Authorizing Provider  acetaminophen (TYLENOL) 325 MG tablet Take 650 mg by mouth every 4 (four) hours as needed for mild pain.    [provider]  Ascorbic Acid (VITAMIN C PO) Take 1 tablet by mouth daily.    [provider]  dextromethorphan-guaiFENesin (MUCINEX DM) 30-600 MG per 12 hr tablet Take 0.5 tablets by mouth 2 (two) times daily as needed.    [provider]  ibuprofen (ADVIL) 200 MG tablet Take 200 mg by mouth every 6 (six) hours as needed for moderate pain.    [provider]  Multiple Vitamin (MULTIVITAMIN) capsule Take 1 capsule by mouth daily.    [provider]  Senna (CORRECTOL HERBAL TEA PO) Take by mouth.    [provider]  Family History Family History  Problem Relation Age of Onset   Hypertension Mother    Heart attack Father 75       [place]   Stroke Father        2 CVA's    Social History Social History   Tobacco Use   Smoking status: Never   Smokeless tobacco: Never  Vaping Use   Vaping Use: Never used  Substance Use Topics   Alcohol use: No   Drug use: Never     Allergies   Augmentin [amoxicillin-pot clavulanate], Doxycycline, Oxycodone-acetaminophen, and Penicillins   Review of Systems Review of Systems  Constitutional:  Negative for chills, diaphoresis, fatigue and fever.  HENT:  Negative for congestion, ear pain, rhinorrhea, sinus pressure, sinus pain and sore throat.   Respiratory:  Negative for cough and shortness of breath.   Gastrointestinal:  Negative for abdominal pain, nausea and vomiting.   Musculoskeletal:  Negative for arthralgias and myalgias.  Skin:  Negative for rash.  Neurological:  Positive for headaches. Negative for weakness.  Hematological:  Negative for adenopathy.     Physical Exam Triage Vital Signs ED Triage Vitals  Enc Vitals Group     BP      Pulse      Resp      Temp      Temp src      SpO2      Weight      Height      Head Circumference      Peak Flow      Pain Score      Pain Loc      Pain Edu?      Excl. in [place]?    No data found.  Updated Vital Signs BP (!) 133/90 (BP Location: Left Arm)   Pulse 69   Temp 98.5 F (36.9 C) (Oral)   Resp 14   Ht 5\' 3"  (1.6 m)   Wt 145 lb 1 oz (65.8 kg)   SpO2 100%   BMI 25.70 kg/m     Physical Exam Vitals and nursing note reviewed.  Constitutional:      General: She is not in acute distress.    Appearance: Normal appearance. She is not ill-appearing or toxic-appearing.  HENT:     Head: Normocephalic and atraumatic.     Nose: Nose normal.     Mouth/Throat:     Mouth: Mucous membranes are moist.     Pharynx: Oropharynx is clear.  Eyes:     General: No scleral icterus.       Right eye: No discharge.        Left eye: No discharge.     Conjunctiva/sclera: Conjunctivae normal.  Cardiovascular:     Rate and Rhythm: Normal rate and regular rhythm.     Heart sounds: Normal heart sounds.  Pulmonary:     Effort: Pulmonary effort is normal. No respiratory distress.     Breath sounds: Normal breath sounds.  Musculoskeletal:     Cervical back: Neck supple.  Skin:    General: Skin is dry.  Neurological:     General: No focal deficit present.     Mental Status: She is alert. Mental status is at baseline.     Motor: No weakness.     Gait: Gait normal.  Psychiatric:        Mood and Affect: Mood normal.        Behavior: Behavior normal.        Thought  Content: Thought content normal.      UC Treatments / Results  Labs (all labs ordered are listed, but only abnormal results are  displayed) Labs Reviewed  RESP PANEL BY RT-PCR (RSV, FLU A&B, COVID)  [id number]    EKG   Radiology No results found.  Procedures Procedures (including critical care time)  Medications Ordered in UC Medications - No data to display  Initial Impression / Assessment and Plan / UC Course  I have reviewed the triage vital signs and the nursing notes.  Pertinent labs & imaging results that were available during my care of the patient were reviewed by me and considered in my medical decision making (see chart for details).   52 year old female presents for mild headaches after COVID exposure.  Requesting screening.  No other symptoms.  Vitals normal and stable and she is overall well-appearing.  Exam is benign today.  PCR COVID test obtained. Negative.  Discussed home testing for COVID with patient.  Advised performing a home test at least twice after an exposure over a week's period of time.  Advised her to come to our department if she has a negative home test but is still very symptomatic and we can check her.  Supportive care for her headache.  Reviewed ED precautions if headache worsens.   Final Clinical Impressions(s) / UC Diagnoses   Final diagnoses:  Acute nonintractable headache, unspecified headache type  Exposure to COVID-19 virus     Discharge Instructions      -COVID test is negative.  Retest if you develop more symptoms such as cough, fever, congestion, etc. - Continue with Tylenol for headache.  Go to emergency department for any severe acute headaches are the worst you have ever had or any associated dizziness, weakness, vomiting, vision changes, etc.     ED Prescriptions   None    PDMP not reviewed this encounter.   Danton Clap, PA-C 02/07/22 [id number]

## 2022-02-07 NOTE — Discharge Instructions (Addendum)
-  COVID test is negative.  Retest if you develop more symptoms such as cough, fever, congestion, etc. - Continue with Tylenol for headache.  Go to emergency department for any severe acute headaches are the worst you have ever had or any associated dizziness, weakness, vomiting, vision changes, etc.

## 2022-02-07 NOTE — ED Triage Notes (Signed)
Patient c/o headache that started 3 days ago.  Patient denies any other cold symptoms.  Patient denies fevers.  Patient states that she was exposed to covid at her job.

## 2022-07-27 ENCOUNTER — Ambulatory Visit: Payer: 59 | Admitting: Podiatry
